# Patient Record
Sex: Male | Born: 1995 | Hispanic: Refuse to answer | Marital: Single | Smoking: Former smoker
Health system: Southern US, Community
[De-identification: ages and names within clinical notes are randomized; demographics above are authoritative.]

## PROBLEM LIST (undated history)

## (undated) DIAGNOSIS — J45909 Unspecified asthma, uncomplicated: Secondary | ICD-10-CM

## (undated) HISTORY — PX: NO PAST SURGERIES: SHX2092

---

## 2016-02-02 ENCOUNTER — Ambulatory Visit
Admit: 2016-02-02 | Discharge: 2016-02-02 | Disposition: A | Discharge status: Home / Self Care | Payer: Worker's Compensation | Attending: Family Medicine | Admitting: Family Medicine

## 2016-02-02 ENCOUNTER — Ambulatory Visit
Admission: EM | Admit: 2016-02-02 | Discharge: 2016-02-02 | Disposition: A | Discharge status: Home / Self Care | Payer: Worker's Compensation | Attending: Family Medicine | Admitting: Family Medicine

## 2016-02-02 ENCOUNTER — Encounter: Payer: Self-pay | Admitting: *Deleted

## 2016-02-02 ENCOUNTER — Ambulatory Visit (INDEPENDENT_AMBULATORY_CARE_PROVIDER_SITE_OTHER): Payer: Worker's Compensation

## 2016-02-02 DIAGNOSIS — S0990XA Unspecified injury of head, initial encounter: Secondary | ICD-10-CM | POA: Diagnosis not present

## 2016-02-02 DIAGNOSIS — S161XXA Strain of muscle, fascia and tendon at neck level, initial encounter: Secondary | ICD-10-CM

## 2016-02-02 HISTORY — DX: Unspecified asthma, uncomplicated: J45.909

## 2016-02-02 NOTE — ED Provider Notes (Signed)
[facility] ____________________________________________  Time seen: Approximately 3:09 PM  I have reviewed the triage vital signs and the nursing notes.   HISTORY  Chief Complaint Head Injury   HPI Jacob Tate is a 20 y.o. male resents for evaluation after head injury. Patient reports 3 days ago he was at work when he hit his head. Patient reports that he dropped his coat and went to pick it up. Patient reports in the process of coming up he hit his head on a mounted first aid kit. Patient reports he hit the back of his head. Reports he did not fall to the ground. Denies loss of consciousness. Patient reports he did have a headache onset fairly close to the time of injury but reports mild. Reports he is able to continue to work throughout the rest of his shift. Patient reports injury occurred early Friday morning.  Patient reports Saturday when he woke up he had a continued headache and reports he felt very tired. Patient states that he slept majority of the day on Saturday. Patient does report that while he was laying in bed watching [employer] he "dozed off "described as falling asleep and in the process he rolled off his bed. Denies further injury from this falling out of bed. Reports he then got back in bed and went back to sleep. Patient reports that yesterday he went to work and was only able to work a few hours. Patient reports that while he was at work yesterday he felt very tired, had a continued headache, and even further reported he had a difficult time speaking as he had some slurred speech. Patient reports he is also having some lightheadedness. Denies room spinning sensation. Patient reports his parents drove him home after a few hours at work and then rested at home. Patient states he has not had any other episodes of slurred speech or speech changes or overwhelming fatigue. Patient reports today he does still have a mild headache. Denies any other accompanied symptoms  at this time. Patient again reports injury occurred 3 days ago, denies any other injury. Reports has not been taking medications at home for the same.  Patient again denies loss of consciousness. Denies nausea, vomiting, diarrhea, vision changes, back pain, extremity pain, paresthesias, history of head injury or history of concussions. Denies break in skin or wound.   Past Medical History:  Diagnosis Date  . Asthma     There are no active problems to display for this patient.   History reviewed. No pertinent surgical history.  Current Outpatient Rx  . Order #: [id number] Class: Historical Med    No current facility-administered medications for this encounter.   Current Outpatient Prescriptions:  .  albuterol (PROVENTIL HFA;VENTOLIN HFA) 108 (90 Base) MCG/ACT inhaler, Inhale 1-2 puffs into the lungs every 6 (six) hours as needed for wheezing or shortness of breath., Disp: , Rfl:   Allergies Nsaids  History reviewed. No pertinent family history.  Social History Social History  Substance Use Topics  . Smoking status: Current Every Day Smoker    Packs/day: 1.00  . Smokeless tobacco: Never Used  . Alcohol use Yes    Review of Systems Constitutional: No fever/chills Eyes: No visual changes. ENT: No sore throat. Cardiovascular: Denies chest pain. Respiratory: Denies shortness of breath. Gastrointestinal: No abdominal pain.  No nausea, no vomiting.  No diarrhea.  No constipation. Genitourinary: Negative for dysuria. Musculoskeletal: Negative for back pain. Skin: Negative for rash. Neurological: Negative for  focal weakness or numbness.  10-point ROS otherwise negative.  ____________________________________________   PHYSICAL EXAM:  VITAL SIGNS: ED Triage Vitals  Enc Vitals Group     BP 02/02/16 1423 109/66     Pulse Rate 02/02/16 1423 63     Resp 02/02/16 1423 16     Temp 02/02/16 1423 98.2 F (36.8 C)     Temp Source 02/02/16 1423 Oral     SpO2 02/02/16 1423  99 %     Weight 02/02/16 1426 140 lb (63.5 kg)     Height 02/02/16 1426 _0  (1.702 m)     Head Circumference --      Peak Flow --      Pain Score --      Pain Loc --      Pain Edu? --      Excl. in [place]? --     Constitutional: Alert and oriented. Well appearing and in no acute distress. Eyes: Conjunctivae are normal. PERRL. EOMI. No pain with EOMs.  Head: Atraumatic.No sinus TTP. Minimal occipital tenderness to palpation, no swelling or hematoma noted.   Ears: no erythema, normal TMs bilaterally.   Nose: No congestion/rhinnorhea.  Mouth/Throat: Mucous membranes are moist.  Oropharynx non-erythematous. Neck: No stridor.  No cervical spine tenderness to palpation.  Hematological/Lymphatic/Immunilogical: No cervical lymphadenopathy. Cardiovascular: Normal rate, regular rhythm. Grossly normal heart sounds.  Good peripheral circulation. Respiratory: Normal respiratory effort.  No retractions. Lungs CTAB. No wheezes, rales or rhonchi. Gastrointestinal: Soft and nontender. No distention. Normal Bowel sounds.  No CVA tenderness. Musculoskeletal: No lower or upper extremity tenderness nor edema. No midline thoracic or lumbar tenderness to palpation. Changes positions quickly in room with steady gait.  Neurologic:  Normal speech and language. No gross focal neurologic deficits are appreciated. No gait instability. No ataxia, normal finger to nose, normal heel to shins.5/5 strength to bilateral upper and lower extremities.  Negative Romberg. No meningismus.   Skin:  Skin is warm, dry and intact. No rash noted. Psychiatric: Mood and affect are normal. Speech and behavior are normal.  ___________________________________________   LABS (all labs ordered are listed, but only abnormal results are displayed)  Labs Reviewed - No data to display  RADIOLOGY  Dg Cervical Spine Complete  Result Date: 02/02/2016 CLINICAL DATA:  Upper mid posterior neck pain after injury 4 days prior. EXAM: CERVICAL  SPINE - COMPLETE 4+ VIEW COMPARISON:  None. FINDINGS: On the lateral view the cervical spine is visualized to the level of C7-T1. Straightening of the cervical spine. Pre-vertebral soft tissues are within normal limits. No fracture is detected in the cervical spine. Dens is well positioned between the lateral masses of C1. Cervical disc heights are preserved, with no appreciable spondylosis. No cervical spine subluxation. No significant facet arthropathy. No appreciable foraminal stenosis. No aggressive-appearing focal osseous lesions. IMPRESSION: 1. No cervical spine fracture or subluxation. 2. Straightening of the cervical spine, usually due to positioning and/or muscle spasm. Electronically Signed   By: Ilona Sorrel M.D.   On: 02/02/2016 16:11   Ct Head Wo Contrast  Result Date: 02/02/2016 CLINICAL DATA:  [place] and hit back of head on Friday. Persistent headache. EXAM: CT HEAD WITHOUT CONTRAST TECHNIQUE: Contiguous axial images were obtained from the base of the skull through the vertex without intravenous contrast. COMPARISON:  None. FINDINGS: Brain: The ventricles are normal in size and configuration. No extra-axial fluid collections are identified. The gray-white differentiation is normal. No CT findings for acute intracranial process such as hemorrhage or infarction. No mass lesions.  The brainstem and cerebellum are grossly normal. Vascular: No hyperdense vessel or unexpected calcification. Skull: Normal. Negative for fracture or focal lesion. Sinuses/Orbits: Scattered ethmoid sinus disease and mild mucoperiosteal thickening involving the maxillary sinuses. The mastoid air cells and middle ear cavities are clear. The globes are intact. Other: No skull laceration or hematoma is identified. IMPRESSION: Normal head CT. Electronically Signed   By: Marijo Sanes M.D.   On: 02/02/2016 15:24   ____________________________________________   PROCEDURES Procedures    INITIAL IMPRESSION / ASSESSMENT AND  PLAN / ED COURSE  Pertinent labs & imaging results that were available during my care of the patient were reviewed by me and considered in my medical decision making (see chart for details).  Very well-appearing patient. No acute distress. Presenting for evaluation post head injury 3 days ago. Reports head injury was sustained when lifting his head up after bending down hitting his head on a non-moving object on the wall. Denies loss of consciousness. Patient reports onset of headache shortly after. Reports he had multiple other symptoms over the last 2 days, but reports only headache today. Exam reassuring. No focal neurological deficits. Patient had mild cervical tenderness, suspect muscular strain. Will evaluate CT head and cervical films.  Per radiologist CT head normal head CT. Per Radiologist cervical spine, no cervical spine fracture or subluxation. Well-appearing patient. Discussed in detail with patient likely postconcussive syndrome. As patient is overall doing better without any other accompanying symptoms at this time other than headache of which he describes as mild and very mild cervical pain recommend follow-up outpatient. Encourage rest, supportive care, hydration and avoidance of screen time. Work note given to avoid strenuous activity, heavy lifting in or operating machinery until follow-up with [place] NP to ensure patient feeling well and able to return to full work activity. 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 Discussed use of over-the-counter Tylenol as needed for pain.  Discussed follow up with Primary care physician this week. Discussed follow up and return parameters including no resolution or any worsening concerns. Patient verbalized understanding and agreed to plan.   ____________________________________________   FINAL CLINICAL IMPRESSION(S) / ED DIAGNOSES  Final diagnoses:  Injury of head, initial encounter  Strain  of neck muscle, initial encounter     Discharge Medication List as of 02/02/2016  4:30 PM      Note: This dictation was prepared with Dragon dictation along with smaller phrase technology. Any transcriptional errors that result from this process are unintentional.    Clinical Course      Marylene Land, NP 02/02/16 2021

## 2016-02-02 NOTE — ED Notes (Signed)
Prior authorization for head CT given at 1448, 02/02/16, by Kaleen Maskhristine Bender, Lindie SpruceSheetz [facility] representative at [phone].

## 2016-02-02 NOTE — Discharge Instructions (Signed)
Take medication as prescribed. Rest. Drink plenty of fluids. Avoid frequent screen time.  Follow up with Tommie Maximiano CossAnne Moore NP this week.   Follow up with your primary care physician this week as needed. Return to Urgent care for new or worsening concerns.

## 2016-02-02 NOTE — ED Triage Notes (Signed)
Pt was bending over at work and raised up to stand and struck head on a metal cabinet. Now c/o soreness to occiptal scalp region, headaches, dizziness, fatigue, slurred speech, and unsteady gait. This occurred on Friday. Pt began to feel symptoms on Saturday, and fell out of bed Saturday night. Attempted to work yesterday but was unable to continue and had to be driven home.

## 2017-04-08 IMAGING — CR DG CERVICAL SPINE COMPLETE 4+V
6 series · 7 of 7 positions shown · non-contrast
Comparison: None.

CLINICAL DATA: Upper mid posterior neck pain after injury 4 days
prior.

EXAM:
CERVICAL SPINE - COMPLETE 4+ VIEW

[c-spine lat]
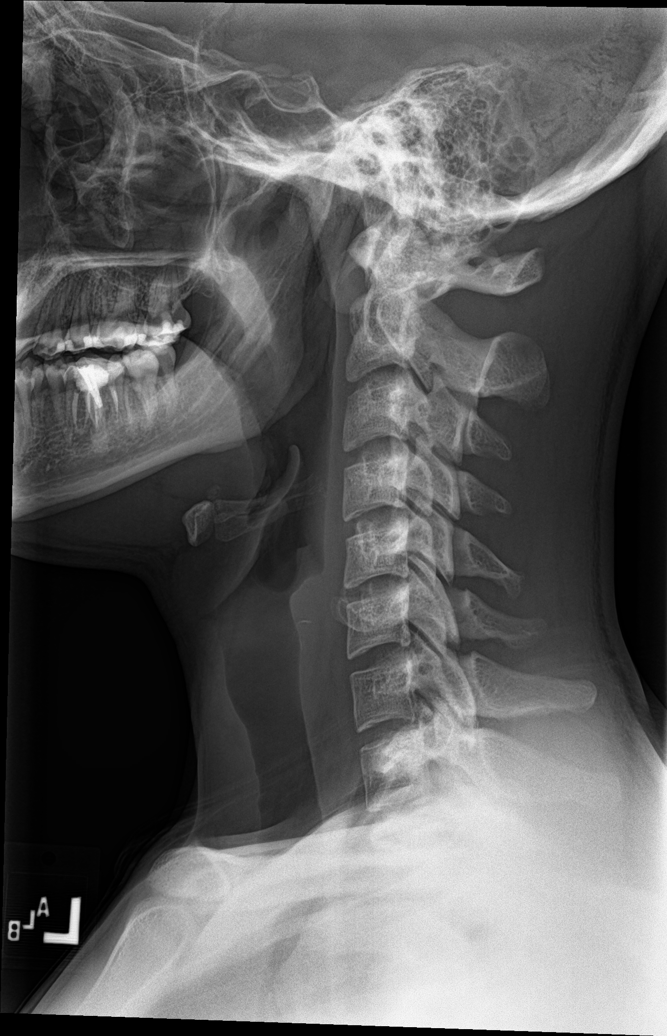

[c-spine obl (1 of 2)]
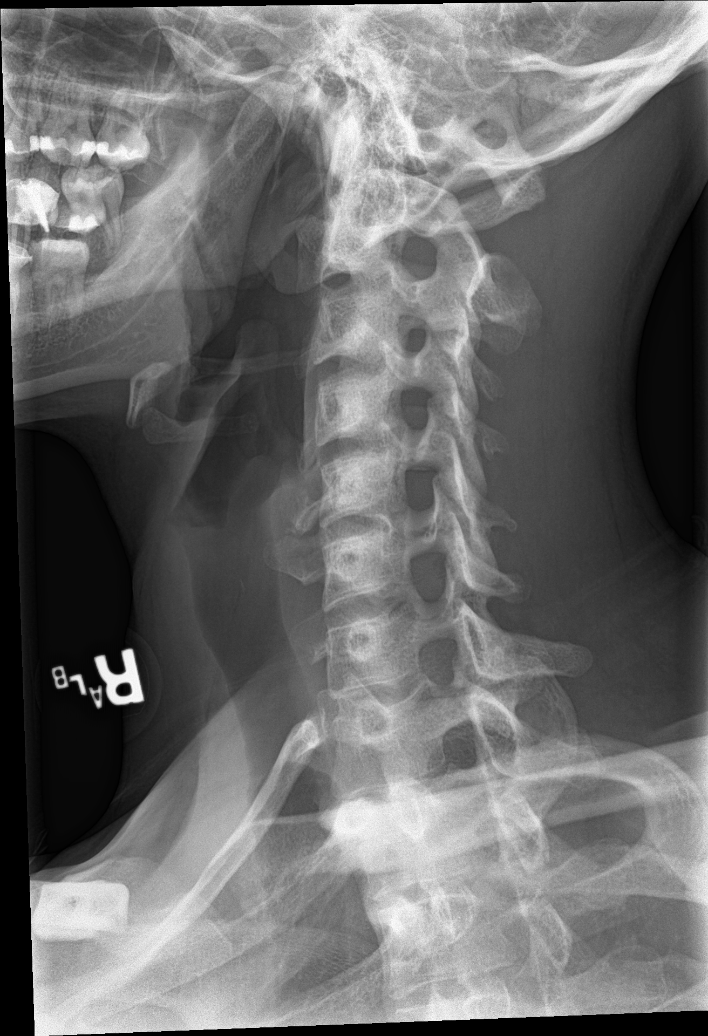

[c-spine obl (2 of 2)]
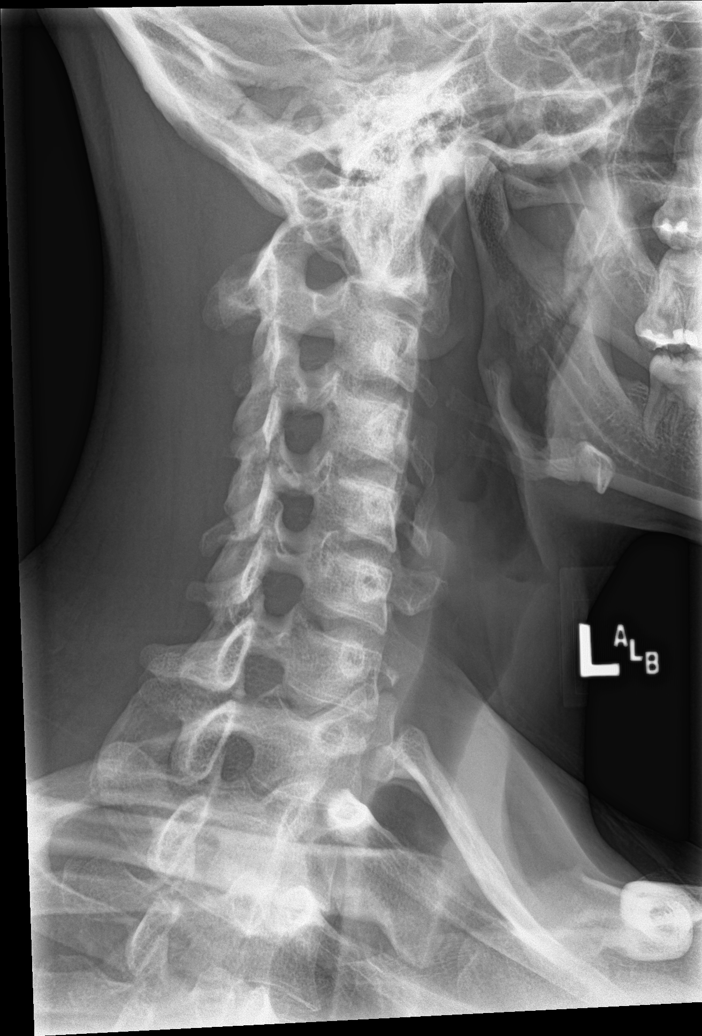

[c-spine ap]
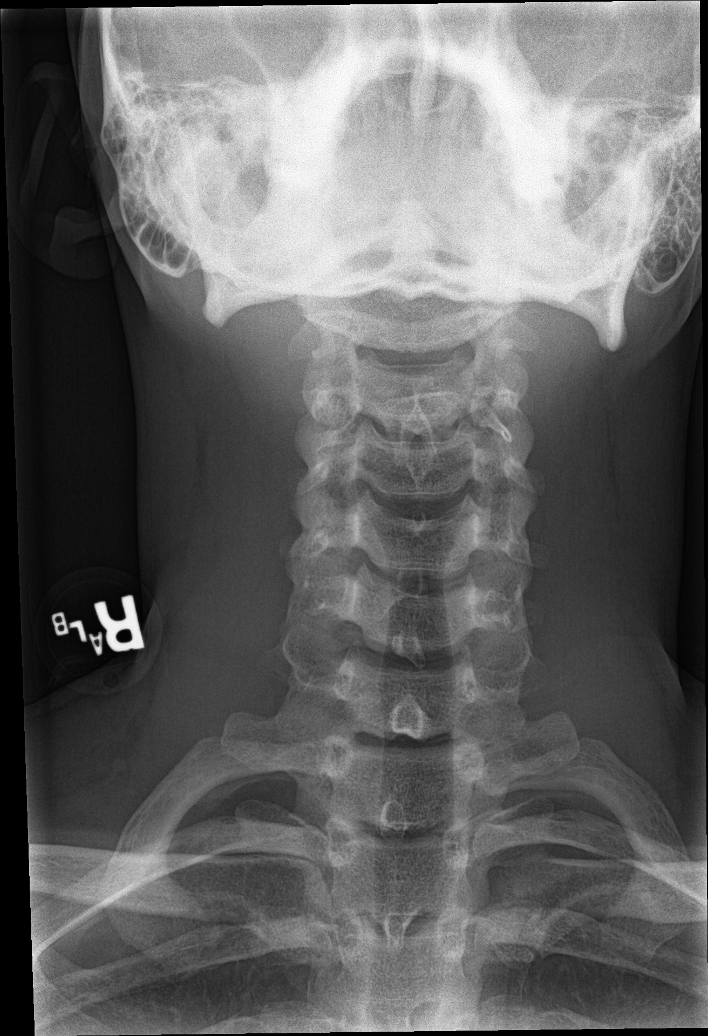

[Series 5: c-spine open mouth · 0.14mm/px · 2 of 2 slices shown]
[im 1/2]
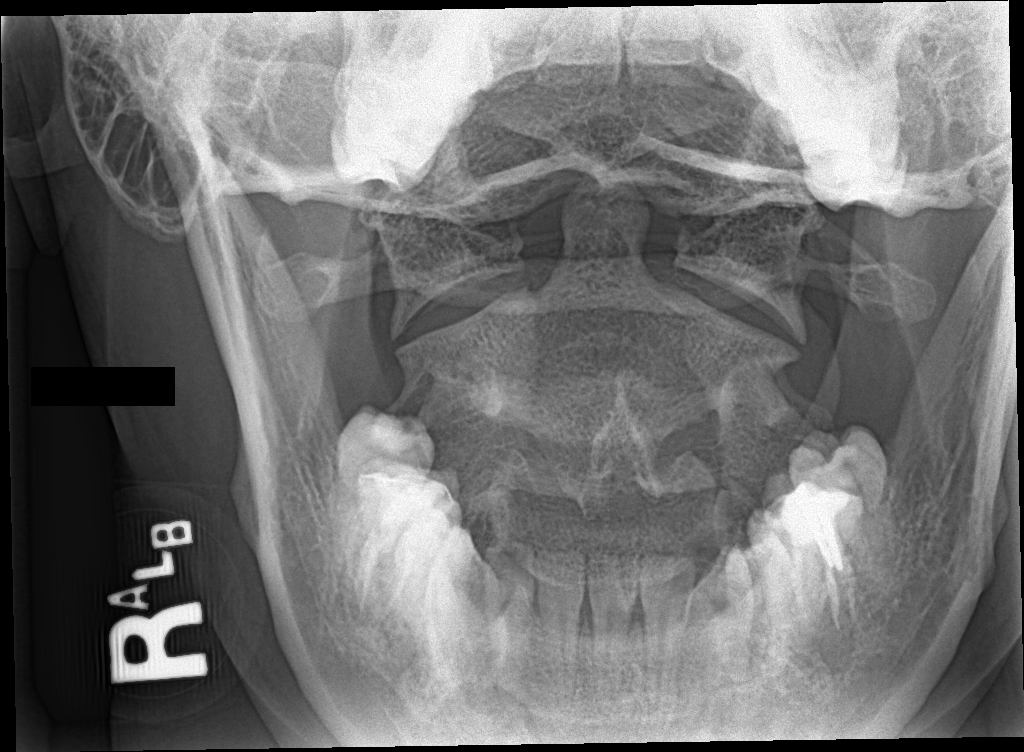
[im 2/2]
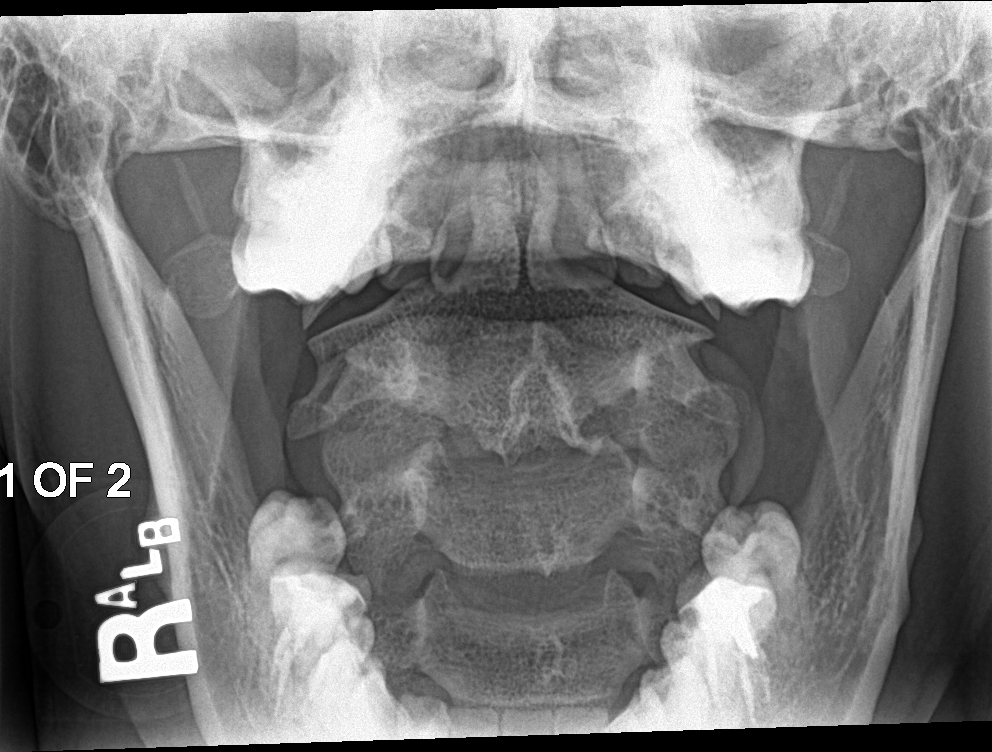

[[person_name]]
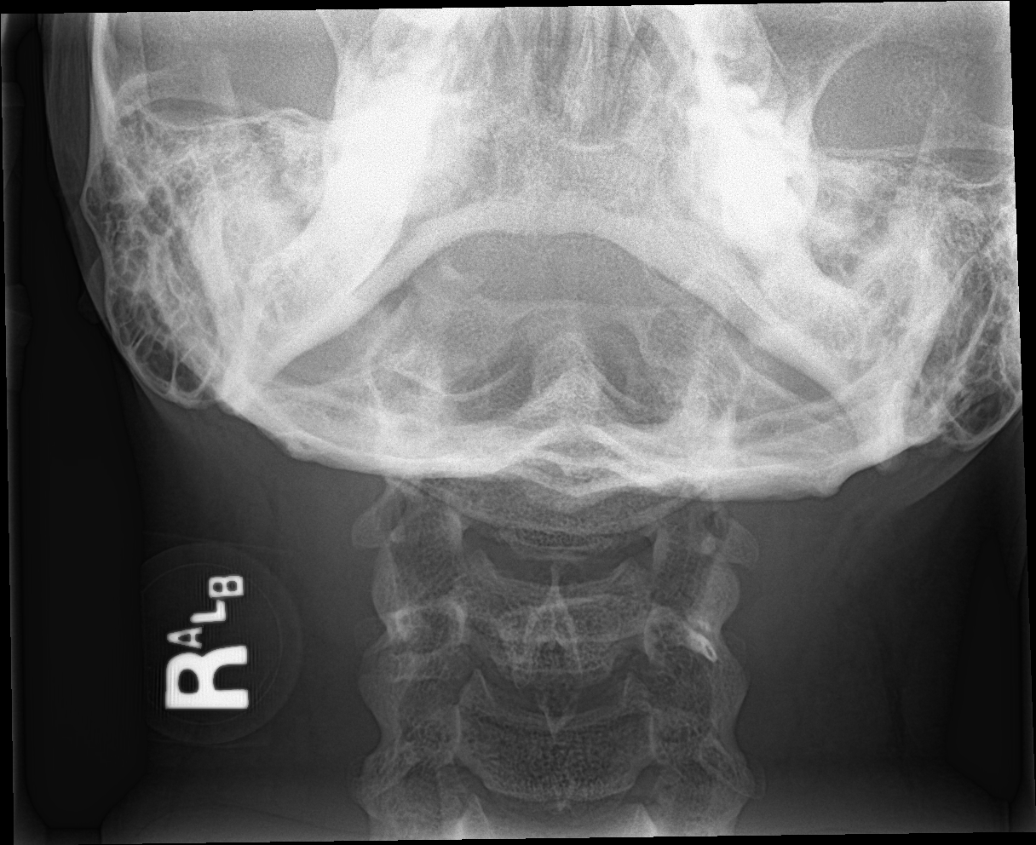

[7 of 7 positions shown; findings below may reference images not displayed]

FINDINGS: On the lateral view the cervical spine is visualized to the level of
C7-T1. Straightening of the cervical spine. Pre-vertebral soft
tissues are within normal limits. No fracture is detected in the
cervical spine. Dens is well positioned between the lateral masses
of C1. Cervical disc heights are preserved, with no appreciable
spondylosis. No cervical spine subluxation. No significant facet
arthropathy. No appreciable foraminal stenosis. No
aggressive-appearing focal osseous lesions.
IMPRESSION: 1. No cervical spine fracture or subluxation.
2. Straightening of the cervical spine, usually due to positioning
and/or muscle spasm.

## 2017-09-28 ENCOUNTER — Ambulatory Visit
Admission: EM | Admit: 2017-09-28 | Discharge: 2017-09-28 | Disposition: A | Discharge status: Home / Self Care | Payer: Self-pay | Attending: Family Medicine | Admitting: Family Medicine

## 2017-09-28 DIAGNOSIS — B349 Viral infection, unspecified: Secondary | ICD-10-CM

## 2017-09-28 DIAGNOSIS — R05 Cough: Secondary | ICD-10-CM

## 2017-09-28 DIAGNOSIS — R062 Wheezing: Secondary | ICD-10-CM

## 2017-09-28 DIAGNOSIS — J45901 Unspecified asthma with (acute) exacerbation: Secondary | ICD-10-CM

## 2017-09-28 DIAGNOSIS — J029 Acute pharyngitis, unspecified: Secondary | ICD-10-CM

## 2017-09-28 DIAGNOSIS — R0981 Nasal congestion: Secondary | ICD-10-CM

## 2017-09-28 LAB — RAPID STREP SCREEN (MED CTR MEBANE ONLY): Streptococcus, Group A Screen (Direct): NEGATIVE

## 2017-09-28 MED ORDER — ONDANSETRON 4 MG PO TBDP: 4.0000 mg | ORAL_TABLET | Freq: Three times a day (TID) | ORAL | 0 refills | Status: AC | PRN

## 2017-09-28 MED ORDER — ALBUTEROL SULFATE HFA 108 (90 BASE) MCG/ACT IN AERS: 2.0000 | INHALATION_SPRAY | RESPIRATORY_TRACT | 0 refills | Status: AC | PRN

## 2017-09-28 MED ORDER — IPRATROPIUM-ALBUTEROL 0.5-2.5 (3) MG/3ML IN SOLN
3.0000 mL | Freq: Once | RESPIRATORY_TRACT | Status: AC
  Administered 2017-09-28: 3 mL via RESPIRATORY_TRACT

## 2017-09-28 MED ORDER — PREDNISONE 20 MG PO TABS: 40.0000 mg | ORAL_TABLET | Freq: Every day | ORAL | 0 refills | Status: AC

## 2017-09-28 NOTE — ED Provider Notes (Signed)
[facility] ____________________________________________  Time seen: Approximately 11:03 AM  I have reviewed the triage vital signs and the nursing notes.   HISTORY  Chief Complaint Sore Throat   HPI Jacob Tate is a 22 y.o. male presenting for evaluation of runny nose, nasal congestion, cough and sore throat present for the last 3 days.  Also reports on Monday he had onset of some nausea with one episode of vomiting, as well as intermittent diarrhea.  States nausea has been coming and going.  No more vomiting.  Diarrhea for the first 2 days was much more frequent, 5 or 6 episodes a day, today is only had 2 and seems to be slowing down.  Denies any abnormal colored stool or vomit, no melena, no hematochezia or blood in stool.  Has had some chills, denies any known fevers.  Continues to eat and drink well.  States sore throat is much improved and only mild nail with coughing.  Denies any chest pain, shortness of breath or hemoptysis.  Reports he has occasionally heard himself wheeze, but states he has a history of asthma no longer has an albuterol inhaler to use.  States wheeze seems consistent with his normal asthma.  States yesterday he was having abdominal cramping pain, no pain currently.  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  Denies other aggravating or alleviating factors.  Has not been taking medications at home.  Reports otherwise feels well.  Denies known food triggers.  Denies recent sickness. Denies recent antibiotic use.    Past Medical History:  Diagnosis Date  . Asthma     There are no active problems to display for this patient.   History reviewed. No pertinent surgical history.   No current facility-administered medications for this encounter.   Current Outpatient Medications:  .  albuterol (PROVENTIL HFA;VENTOLIN HFA) 108 (90 Base) MCG/ACT inhaler, Inhale 1-2 puffs into the lungs  every 6 (six) hours as needed for wheezing or shortness of breath., Disp: , Rfl:  .  albuterol (PROVENTIL HFA;VENTOLIN HFA) 108 (90 Base) MCG/ACT inhaler, Inhale 2 puffs into the lungs every 4 (four) hours as needed for wheezing., Disp: 1 Inhaler, Rfl: 0 .  ondansetron (ZOFRAN ODT) 4 MG disintegrating tablet, Take 1 tablet (4 mg total) by mouth every 8 (eight) hours as needed., Disp: 15 tablet, Rfl: 0 .  predniSONE (DELTASONE) 20 MG tablet, Take 2 tablets (40 mg total) by mouth daily., Disp: 10 tablet, Rfl: 0  Allergies Nsaids  No family history on file.  Social History Social History   Tobacco Use  . Smoking status: Current Every Day Smoker    Packs/day: 1.00  . Smokeless tobacco: Never Used  Substance Use Topics  . Alcohol use: Yes  . Drug use: Not on file    Review of Systems Constitutional: No fever ENT: as above. Cardiovascular: Denies chest pain. Respiratory: Denies shortness of breath. Gastrointestinal: as above. Genitourinary: Negative for dysuria. Musculoskeletal: Negative for back pain. Skin: Negative for rash. Neurological: Negative for headaches, focal weakness or numbness.   ____________________________________________   PHYSICAL EXAM:  VITAL SIGNS: ED Triage Vitals  Enc Vitals Group     BP 09/28/17 1036 112/70     Pulse Rate 09/28/17 1036 80     Resp 09/28/17 1036 16     Temp 09/28/17 1036 98.9 F (37.2 C)     Temp Source 09/28/17 1036 Oral  SpO2 09/28/17 1036 96 %     Weight 09/28/17 1035 140 lb (63.5 kg)     Height 09/28/17 1035 5\' 7"  (1.702 m)     Head Circumference --      Peak Flow --      Pain Score 09/28/17 1034 7     Pain Loc --      Pain Edu? --      Excl. in [place]? --     Constitutional: Alert and oriented. Well appearing and in no acute distress. Eyes: Conjunctivae are normal. PERRL. EOMI. Head: Atraumatic. No sinus tenderness to palpation. No swelling. No erythema.  Ears: no erythema, normal TMs bilaterally.   Nose:Nasal  congestion  Mouth/Throat: Mucous membranes are moist. Mild pharyngeal erythema. No tonsillar swelling or exudate.  Neck: No stridor.  No cervical spine tenderness to palpation. Hematological/Lymphatic/Immunilogical: No cervical lymphadenopathy. Cardiovascular: Normal rate, regular rhythm. Grossly normal heart sounds.  Good peripheral circulation. Respiratory: Normal respiratory effort.  No retractions.  Mild scattered inspiratory wheezes.  No rhonchi or rales.  Good air movement.  Occasional dry cough noted in room. Gastrointestinal: Soft and nontender. Normal Bowel sounds. No CVA tenderness. Musculoskeletal: Ambulatory with steady gait. No cervical, thoracic or lumbar tenderness to palpation. Neurologic:  Normal speech and language. No gait instability. Skin:  Skin appears warm, dry and intact. No rash noted. Psychiatric: Mood and affect are normal. Speech and behavior are normal.  ___________________________________________   LABS (all labs ordered are listed, but only abnormal results are displayed)  Labs Reviewed  RAPID STREP SCREEN ([facility] ONLY)  CULTURE, GROUP A STREP [facility])   ____________________________________________  PROCEDURES Procedures    INITIAL IMPRESSION / ASSESSMENT AND PLAN / ED COURSE  Pertinent labs & imaging results that were available during my care of the patient were reviewed by me and considered in my medical decision making (see chart for details).  Well-appearing patient.  No acute distress.  Quick strep negative, will culture.  Mild scattered wheezes, DuoNeb given in urgent care with wheezes fully resolved.  Suspect viral illness.  Encourage rest, fluids, supportive care, PRN Zofran, will Rx prednisone and albuterol.  Discussed over-the-counter Imodium use as needed.  Brat diet.  Work note given.Discussed indication, risks and benefits of medications with patient.  Discussed follow up with Primary care physician this week. Discussed follow up and  return parameters including no resolution or any worsening concerns. Patient verbalized understanding and agreed to plan.   ____________________________________________   FINAL CLINICAL IMPRESSION(S) / ED DIAGNOSES  Final diagnoses:  Viral illness  Asthma with acute exacerbation, unspecified asthma severity, unspecified whether persistent     ED Discharge Orders        Ordered    predniSONE (DELTASONE) 20 MG tablet  Daily     09/28/17 1115    albuterol (PROVENTIL HFA;VENTOLIN HFA) 108 (90 Base) MCG/ACT inhaler  Every 4 hours PRN     09/28/17 1115    ondansetron (ZOFRAN ODT) 4 MG disintegrating tablet  Every 8 hours PRN     09/28/17 1115       Note: This dictation was prepared with Dragon dictation along with smaller phrase technology. Any transcriptional errors that result from this process are unintentional.         Renford Dills, NP 09/28/17 1134

## 2017-09-28 NOTE — Discharge Instructions (Signed)
Take medication as prescribed. Rest. Drink plenty of fluids.  Gradual increase food.  Monitor for any fevers, abdominal pain changes, increase chest congestion and have prompt follow-up for any concerning changes.  Follow up with your primary care physician this week as needed. Return to Urgent care for new or worsening concerns.

## 2017-09-28 NOTE — ED Triage Notes (Signed)
As per patient 4 days ago had cold exposure, sore throat and from past 2 days nausea and diarrhea and chills.

## 2017-10-01 LAB — CULTURE, GROUP A STREP (THRC)

## 2018-04-26 ENCOUNTER — Encounter: Payer: Self-pay | Admitting: Emergency Medicine

## 2018-04-26 ENCOUNTER — Other Ambulatory Visit: Payer: Self-pay

## 2018-04-26 ENCOUNTER — Ambulatory Visit
Admission: EM | Admit: 2018-04-26 | Discharge: 2018-04-26 | Disposition: A | Discharge status: Home / Self Care | Payer: Self-pay | Attending: Family Medicine | Admitting: Family Medicine

## 2018-04-26 DIAGNOSIS — Z202 Contact with and (suspected) exposure to infections with a predominantly sexual mode of transmission: Secondary | ICD-10-CM | POA: Insufficient documentation

## 2018-04-26 LAB — CHLAMYDIA/NGC RT PCR (ARMC ONLY)
Chlamydia Tr: NOT DETECTED
N gonorrhoeae: NOT DETECTED

## 2018-04-26 NOTE — ED Triage Notes (Signed)
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 He is not having any symptoms.

## 2018-04-26 NOTE — ED Provider Notes (Signed)
[facility]    CSN: [id number] Arrival date & time: 04/26/18  1739     History   Chief Complaint Chief Complaint  Patient presents with  . Exposure to STD    HPI Jacob Tate is a 23 y.o. male.   23 yo male with a c/o possible STD exposure to HSV. States his ex-girlfriend just informed him that she has HSV. Patient has had unprotected intercourse with her recently several weeks ago. States his girlfriend did not have an outbreak then.  Patient denies any symptoms. Denies any rash, discharge, dysuria, fevers, chills, pain.   The history is provided by the patient.    Past Medical History:  Diagnosis Date  . Asthma     There are no active problems to display for this patient.   Past Surgical History:  Procedure Laterality Date  . NO PAST SURGERIES         Home Medications    Prior to Admission medications   Medication Sig Start Date End Date Taking? Authorizing Provider  albuterol (PROVENTIL HFA;VENTOLIN HFA) 108 (90 Base) MCG/ACT inhaler Inhale 1-2 puffs into the lungs every 6 (six) hours as needed for wheezing or shortness of breath.   Yes [provider]  albuterol (PROVENTIL HFA;VENTOLIN HFA) 108 (90 Base) MCG/ACT inhaler Inhale 2 puffs into the lungs every 4 (four) hours as needed for wheezing. 09/28/17   Renford Dills, NP  ondansetron (ZOFRAN ODT) 4 MG disintegrating tablet Take 1 tablet (4 mg total) by mouth every 8 (eight) hours as needed. 09/28/17   Renford Dills, NP  predniSONE (DELTASONE) 20 MG tablet Take 2 tablets (40 mg total) by mouth daily. 09/28/17   Renford Dills, NP    Family History Family History  Problem Relation Age of Onset  . Healthy Mother   . Healthy Father     Social History Social History   Tobacco Use  . Smoking status: Former Smoker    Packs/day: 1.00  . Smokeless tobacco: Never Used  Substance Use Topics  . Alcohol use: Yes  . Drug use: Yes    Types: Marijuana     Allergies    Nsaids   Review of Systems Review of Systems   Physical Exam Triage Vital Signs ED Triage Vitals  Enc Vitals Group     BP 04/26/18 1901 124/68     Pulse Rate 04/26/18 1901 77     Resp 04/26/18 1901 16     Temp 04/26/18 1901 97.7 F (36.5 C)     Temp Source 04/26/18 1901 Oral     SpO2 04/26/18 1901 100 %     Weight 04/26/18 1858 145 lb (65.8 kg)     Height 04/26/18 1858 5\' 7"  (1.702 m)     Head Circumference --      Peak Flow --      Pain Score 04/26/18 1857 0     Pain Loc --      Pain Edu? --      Excl. in GC? --    No data found.  Updated Vital Signs BP 124/68 (BP Location: Left Arm)   Pulse 77   Temp 97.7 F (36.5 C) (Oral)   Resp 16   Ht 5\' 7"  (1.702 m)   Wt 65.8 kg   SpO2 100%   BMI 22.71 kg/m   Visual Acuity Right Eye Distance:   Left Eye Distance:   Bilateral Distance:    Right Eye Near:   Left Eye Near:  Bilateral Near:     Physical Exam Vitals signs and nursing note reviewed.  Constitutional:      General: He is not in acute distress.    Appearance: He is not toxic-appearing or diaphoretic.  Genitourinary:    Penis: Normal.      Scrotum/Testes: Normal.  Neurological:     Mental Status: He is alert.      UC Treatments / Results  Labs (all labs ordered are listed, but only abnormal results are displayed) Labs Reviewed  CHLAMYDIA/NGC RT PCR [facility] ONLY)    EKG None  Radiology No results found.  Procedures Procedures (including critical care time)  Medications Ordered in UC Medications - No data to display  Initial Impression / Assessment and Plan / UC Course  I have reviewed the triage vital signs and the nursing notes.  Pertinent labs & imaging results that were available during my care of the patient were reviewed by me and considered in my medical decision making (see chart for details).      Final Clinical Impressions(s) / UC Diagnoses   Final diagnoses:  STD exposure    ED Prescriptions    None       1. diagnosis reviewed with patient 2. Check urine gc/chlamydia 3. Monitor for any developing symptoms 4. Follow-up prn if symptoms worsen or don't improve  Controlled Substance Prescriptions [place] Controlled Substance Registry consulted? Not Applicable   Payton Mccallumonty, Jerrit Horen, MD 04/26/18 [phone]
